# Patient Record
Sex: Female | Born: 1995 | Hispanic: No | Marital: Single | Smoking: Never smoker
Health system: Southern US, Community
[De-identification: ages and names within clinical notes are randomized; demographics above are authoritative.]

## PROBLEM LIST (undated history)

## (undated) DIAGNOSIS — R197 Diarrhea, unspecified: Secondary | ICD-10-CM

## (undated) DIAGNOSIS — R55 Syncope and collapse: Secondary | ICD-10-CM

## (undated) DIAGNOSIS — R109 Unspecified abdominal pain: Secondary | ICD-10-CM

## (undated) HISTORY — DX: Syncope and collapse: R55

## (undated) HISTORY — DX: Unspecified abdominal pain: R10.9

## (undated) HISTORY — DX: Diarrhea, unspecified: R19.7

---
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
## 2018-07-26 ENCOUNTER — Ambulatory Visit: Payer: BLUE CROSS/BLUE SHIELD | Admitting: Mental Health

## 2019-01-18 ENCOUNTER — Other Ambulatory Visit: Payer: Self-pay | Admitting: Family Medicine

## 2019-01-18 DIAGNOSIS — R109 Unspecified abdominal pain: Secondary | ICD-10-CM

## 2019-01-23 ENCOUNTER — Ambulatory Visit
Admission: RE | Admit: 2019-01-23 | Discharge: 2019-01-23 | Disposition: A | Discharge status: Home / Self Care | Payer: Self-pay | Source: Ambulatory Visit | Attending: Family Medicine | Admitting: Family Medicine

## 2019-01-23 DIAGNOSIS — R109 Unspecified abdominal pain: Secondary | ICD-10-CM

## 2019-04-16 ENCOUNTER — Ambulatory Visit: Payer: BC Managed Care – PPO | Admitting: Neurology

## 2019-04-16 ENCOUNTER — Other Ambulatory Visit: Payer: Self-pay

## 2019-04-16 ENCOUNTER — Encounter: Payer: Self-pay | Admitting: Neurology

## 2019-04-16 DIAGNOSIS — F5104 Psychophysiologic insomnia: Secondary | ICD-10-CM | POA: Diagnosis not present

## 2019-04-16 DIAGNOSIS — R55 Syncope and collapse: Secondary | ICD-10-CM | POA: Diagnosis not present

## 2019-04-16 DIAGNOSIS — R42 Dizziness and giddiness: Secondary | ICD-10-CM | POA: Insufficient documentation

## 2019-04-16 DIAGNOSIS — G4700 Insomnia, unspecified: Secondary | ICD-10-CM | POA: Insufficient documentation

## 2019-04-16 MED ORDER — TRAZODONE HCL 50 MG PO TABS: 50.0000 mg | ORAL_TABLET | Freq: Every evening | ORAL | 5 refills | Status: AC | PRN

## 2019-04-16 MED ORDER — FLUDROCORTISONE ACETATE 0.1 MG PO TABS: ORAL_TABLET | ORAL | 5 refills | Status: AC

## 2019-04-16 NOTE — Progress Notes (Signed)
[facility]  PATIENT: Maria Summers DOB: [birth year]  REFERRING DOCTOR OR PCP:  Farris Has SOURCE: Patient, notes from primary care, notes from Dr. Sharene Skeans  _________________________________   HISTORICAL  CHIEF COMPLAINT:  Chief Complaint  Patient presents with  . New Patient (Initial Visit)    RM 12, alone. Paper referral for syncope from Farris Has, MD.     HISTORY OF PRESENT ILLNESS:  I had the pleasure of seeing your patient, Maria Summers, at [facility] for neurologic consultation regarding her episodes of lightheadedness and recent syncope.  She is a 23 year old woman who had a recent episode of syncope.    She notes feeling much more fatigued than usual.   She felt lightheaded getting up and doing a few chores  and then had more lightheadedness and then lost consciousness.    She had no injuries.  Her brother found her after she had lost consciousness but did not witness.   She had LOC about a minute, she estimates.   She came to and was still woozy, so her brother got her legs up and she stayed on the floor 10-15 more minutes.   She is often thirsty and drinks water a lot during the day.   She did not have any fevers, vomiting or diarrhea that day.  Her pulse is often 90-100, even when laying down.   She had her first episode of syncope around age 23 and then began having LOC spells at least twice a month.    She saw Dr. Sharene Skeans between 2012 and 2015.  She was diagnosed with vasovagal syncope.  He also noted that some of this episodes were more dissociative and possibly related to anxiety.  She also mentioned that she was also told she could have POTS.  Orthostatic vital signs were performed on multiple occasions and she sometimes had mild reduction of blood pressure but never an extreme change nor a large increase in pulse rate..   She had a negative cardiac evaluation.  Tilt table has not been performed.  She was placed on fluids and  electrolytes with some benefit.   She was placed on fludrocortisone in late 2014.  She thinks she stayed on the medication for about 6 months and does not recall how she had done.  She still had some episodes of syncope while in college but they were more rare, only occurring a few times a year.  However, she continued to have episodes of lightheadedness with standing and these persisted after she graduated and return to the area.  The recent episode of syncope was her second this year..       She takes Adderall XR 20 mg once daily for fatigue and ADD.   She reports a long history of She gets some benefit  She has had some insomnia, even before taking Adderall.   She has trouble falling asleep and staying asleep.    Melatonin helps her fall asleep but she is not rested in the morning.  She has good sleep hygiene and meditates many   She had 2 EEGs in 2012 and 2014 that were normal.    REVIEW OF SYSTEMS: Constitutional: No fevers, chills, sweats, or change in appetite Eyes: No visual changes, double vision, eye pain Ear, nose and throat: No hearing loss, ear pain, nasal congestion, sore throat Cardiovascular: No chest pain, palpitations Respiratory: No shortness of breath at rest or with exertion.   No wheezes GastrointestinaI: No nausea, vomiting, diarrhea,  abdominal pain, fecal incontinence Genitourinary: No dysuria, urinary retention or frequency.  No nocturia. Musculoskeletal: No neck pain, back pain Integumentary: No rash, pruritus, skin lesions Neurological: as above Psychiatric: No depression at this time.  No anxiety Endocrine: No palpitations, diaphoresis, change in appetite, change in weigh or increased thirst Hematologic/Lymphatic: No anemia, purpura, petechiae. Allergic/Immunologic: No itchy/runny eyes, nasal congestion, recent allergic reactions, rashes  ALLERGIES: No Known Allergies  HOME MEDICATIONS:  Current Outpatient Medications:  .   amphetamine-dextroamphetamine (ADDERALL XR) 20 MG 24 hr capsule, Take 20 mg by mouth daily., Disp: , Rfl:  .  fludrocortisone (FLORINEF) 0.1 MG tablet, Take 1 tablets at bedtime, Disp: 30 tablet, Rfl: 5 .  traZODone (DESYREL) 50 MG tablet, Take 1 tablet (50 mg total) by mouth at bedtime as needed for sleep., Disp: 30 tablet, Rfl: 5  PAST MEDICAL HISTORY: Past Medical History:  Diagnosis Date  . Abdominal pain, recurrent since 7 th grade   IBS  . Diarrhea   . Syncope and collapse     PAST SURGICAL HISTORY: History reviewed. No pertinent surgical history.  FAMILY HISTORY: Family History  Problem Relation Age of Onset  . Cancer Paternal Grandmother        Died due to brain tumor  . Hypertension Mother   . Ovarian cancer Paternal Aunt 44  . Celiac disease Neg Hx   . Inflammatory bowel disease Neg Hx     SOCIAL HISTORY:  Social History   Socioeconomic History  . Marital status: Single    Spouse name: Not on file  . Number of children: 0  . Years of education: Not on file  . Highest education level: Not on file  Occupational History  . Not on file  Social Needs  . Financial resource strain: Not on file  . Food insecurity    Worry: Not on file    Inability: Not on file  . Transportation needs    Medical: Not on file    Non-medical: Not on file  Tobacco Use  . Smoking status: Never Smoker  . Smokeless tobacco: Never Used  Substance and Sexual Activity  . Alcohol use: Yes    Comment: socially  . Drug use: No  . Sexual activity: Never    Birth control/protection: Abstinence, Pill    Comment: never sexually active  Lifestyle  . Physical activity    Days per week: Not on file    Minutes per session: Not on file  . Stress: Not on file  Relationships  . Social Herbalist on phone: Not on file    Gets together: Not on file    Attends religious service: Not on file    Active member of club or organization: Not on file    Attends meetings of clubs or  organizations: Not on file    Relationship status: Not on file  . Intimate partner violence    Fear of current or ex partner: Not on file    Emotionally abused: Not on file    Physically abused: Not on file    Forced sexual activity: Not on file  Other Topics Concern  . Not on file  Social History Narrative   10th grade-second year in boarding school; family lives in [place]   Caffeine use: 10-16 oz of coffee per day   Left handed     PHYSICAL EXAM  Vitals:   04/16/19 1517  BP: 99/65  Pulse: 85  Temp: 98.7 F (37.1 C)  Weight:  116 lb 6.4 oz (52.8 kg)  Height: 5\' 3"  (1.6 m)    Body mass index is 20.62 kg/m. Orthostatic vital signs: Sitting 96/62 pulse 88 Standing 30 seconds 94/60, pulse 92 Standing 2 minutes 96/62, pulse 92  General: The patient is well-developed and well-nourished and in no acute distress  HEENT:  Head is [place]/AT.  Sclera are anicteric.  Funduscopic exam shows normal optic discs and retinal vessels.  Neck: No carotid bruits are noted.  The neck is nontender.  Cardiovascular: The heart has a regular rate and rhythm with a normal S1 and S2. There were no murmurs, gallops or rubs.    Skin: Extremities are without rash or  edema.  Musculoskeletal:  Back is nontender  Neurologic Exam  Mental status: The patient is alert and oriented x 3 at the time of the examination. The patient has apparent normal recent and remote memory, with an apparently normal attention span and concentration ability.   Speech is normal.  Cranial nerves: Extraocular movements are full. Pupils are equal, round, and reactive to light and accomodation.  Visual fields are full.  Facial symmetry is present. There is good facial sensation to soft touch bilaterally.Facial strength is normal.  Trapezius and sternocleidomastoid strength is normal. No dysarthria is noted.  The tongue is midline, and the patient has symmetric elevation of the soft palate. No obvious hearing deficits are noted.   Motor:  Muscle bulk is normal.   Tone is normal. Strength is  5 / 5 in all 4 extremities.   Sensory: Sensory testing is intact to pinprick, soft touch and vibration sensation in all 4 extremities.  Coordination: Cerebellar testing reveals good finger-nose-finger and heel-to-shin bilaterally.  Gait and station: Station is normal.   Gait is normal. Tandem gait is normal. Romberg is negative.   Reflexes: Deep tendon reflexes are symmetric and normal bilaterally.   Plantar responses are flexor.    DIAGNOSTIC DATA (LABS, IMAGING, TESTING) - I reviewed patient records, labs, notes, testing and imaging myself where available.      ASSESSMENT AND PLAN  Vasovagal syncope - Plan: fludrocortisone (FLORINEF) 0.1 MG tablet  Episodic lightheadedness  Psychophysiological insomnia  In summary, Ms. Ezzard StandingYaacobi is a 23 year old woman with a recent episode of syncope who has had issues with lightheadedness, transient alteration of awareness and syncope since 2012.  Today, orthostatic vital signs were normal.  She did not have any evidence of polyneuropathy or dysautonomia on exam.  Because the spells have persisted and they have affected her, I will have her try Florinef again at a dose of 0.1 mg nightly.  We discussed that if spells persist we will try another medication (either pyridostigmine or ProAmatine).  Hopefully this will also help with her fatigue.  Additionally she has insomnia on a nightly basis and I will prescribe low-dose trazodone as she is already using good sleep hygiene, relaxation/meditation and melatonin has not been of much benefit.  She will return to see me in 3 months or sooner if there are new or worsening neurologic symptoms.  She will call earlier if there is no benefit so that we may try a different medication.   Kervin Bones A. Epimenio FootSater, MD, [facility],FAAN 04/16/2019, 9:27 PM Certified in Neurology, Clinical Neurophysiology, Sleep Medicine and Neuroimaging  [facility] [address], [address] [place], [place] [address] [phone]

## 2019-07-14 ENCOUNTER — Ambulatory Visit: Payer: Self-pay | Admitting: Neurology

## 2019-07-16 ENCOUNTER — Encounter: Payer: Self-pay | Admitting: Neurology

## 2019-10-06 ENCOUNTER — Other Ambulatory Visit (HOSPITAL_COMMUNITY): Payer: Self-pay | Admitting: Internal Medicine

## 2019-10-06 ENCOUNTER — Other Ambulatory Visit: Payer: Self-pay | Admitting: Internal Medicine

## 2019-10-06 DIAGNOSIS — E0590 Thyrotoxicosis, unspecified without thyrotoxic crisis or storm: Secondary | ICD-10-CM

## 2019-10-20 ENCOUNTER — Encounter (HOSPITAL_COMMUNITY): Payer: BLUE CROSS/BLUE SHIELD

## 2019-10-20 ENCOUNTER — Ambulatory Visit (HOSPITAL_COMMUNITY): Payer: BLUE CROSS/BLUE SHIELD

## 2019-10-21 ENCOUNTER — Encounter (HOSPITAL_COMMUNITY): Payer: BLUE CROSS/BLUE SHIELD

## 2019-10-27 ENCOUNTER — Other Ambulatory Visit: Payer: Self-pay

## 2019-10-27 ENCOUNTER — Other Ambulatory Visit (HOSPITAL_COMMUNITY): Payer: BLUE CROSS/BLUE SHIELD

## 2019-10-27 ENCOUNTER — Encounter (HOSPITAL_COMMUNITY)
Admission: RE | Admit: 2019-10-27 | Discharge: 2019-10-27 | Disposition: A | Discharge status: Home / Self Care | Payer: BLUE CROSS/BLUE SHIELD | Source: Ambulatory Visit | Attending: Internal Medicine | Admitting: Internal Medicine

## 2019-10-27 ENCOUNTER — Ambulatory Visit (HOSPITAL_COMMUNITY): Payer: BLUE CROSS/BLUE SHIELD

## 2019-10-27 ENCOUNTER — Encounter (HOSPITAL_COMMUNITY): Payer: Self-pay

## 2019-10-27 DIAGNOSIS — E0590 Thyrotoxicosis, unspecified without thyrotoxic crisis or storm: Secondary | ICD-10-CM | POA: Diagnosis not present

## 2019-10-27 MED ORDER — SODIUM IODIDE I-123 7.4 MBQ CAPS
440.0000 | ORAL_CAPSULE | Freq: Once | ORAL | Status: AC
  Administered 2019-10-27: 440 via ORAL

## 2019-10-28 ENCOUNTER — Encounter (HOSPITAL_COMMUNITY)
Admission: RE | Admit: 2019-10-28 | Discharge: 2019-10-28 | Disposition: A | Discharge status: Home / Self Care | Payer: BLUE CROSS/BLUE SHIELD | Source: Ambulatory Visit | Attending: Internal Medicine | Admitting: Internal Medicine

## 2019-10-28 ENCOUNTER — Other Ambulatory Visit (HOSPITAL_COMMUNITY): Payer: BLUE CROSS/BLUE SHIELD

## 2019-12-08 ENCOUNTER — Other Ambulatory Visit: Payer: Self-pay | Admitting: Family Medicine

## 2019-12-08 DIAGNOSIS — N63 Unspecified lump in unspecified breast: Secondary | ICD-10-CM

## 2021-01-26 IMAGING — US ULTRASOUND ABDOMEN COMPLETE
1 series · 14 of 25 positions shown · non-contrast
Comparison: None.

CLINICAL DATA: Generalized abdominal pain for 2-3 days, mostly in
the epigastric region.

EXAM:
ABDOMEN ULTRASOUND COMPLETE

[Series 1: ultrasound abdomen complete · 0.15mm/px · 14 of 82 slices shown]
[im 1/82]
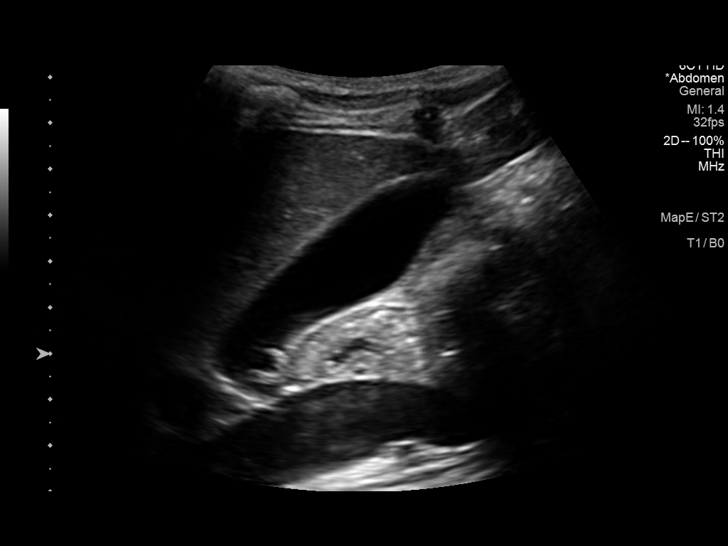
[im 7/82]
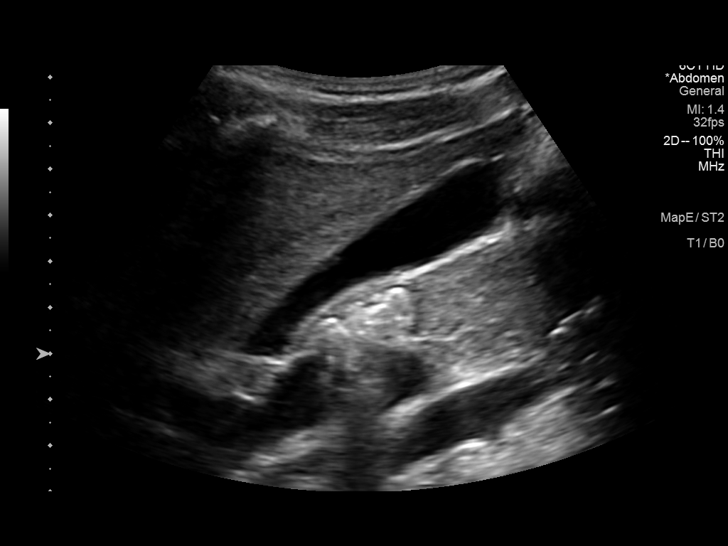
[im 14/82]
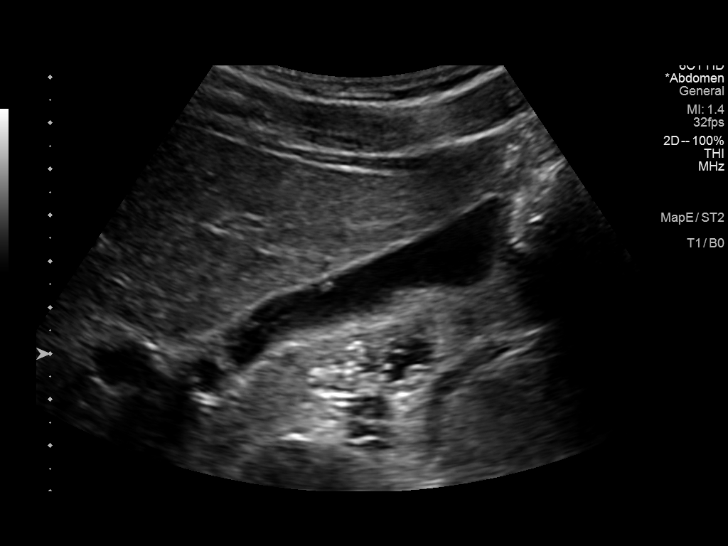
[im 21/82]
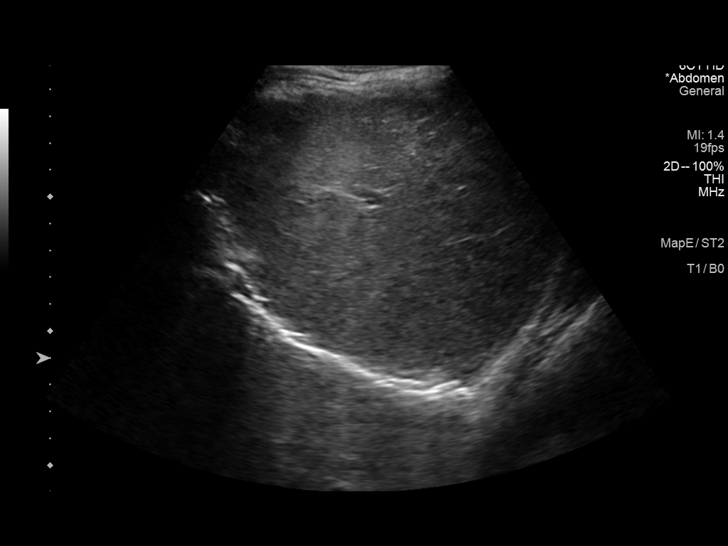
[im 28/82]
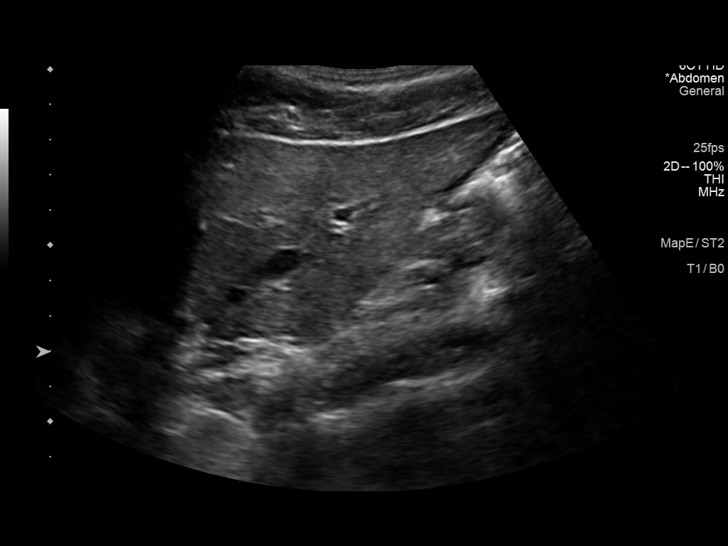
[im 31/82]
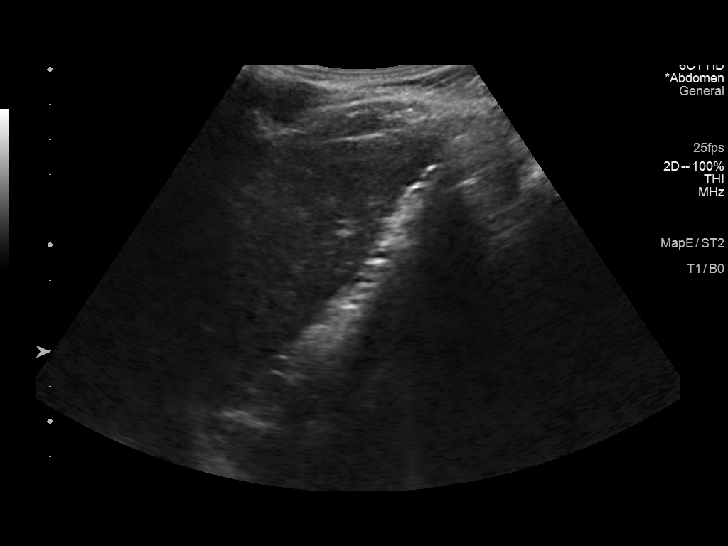
[im 38/82]
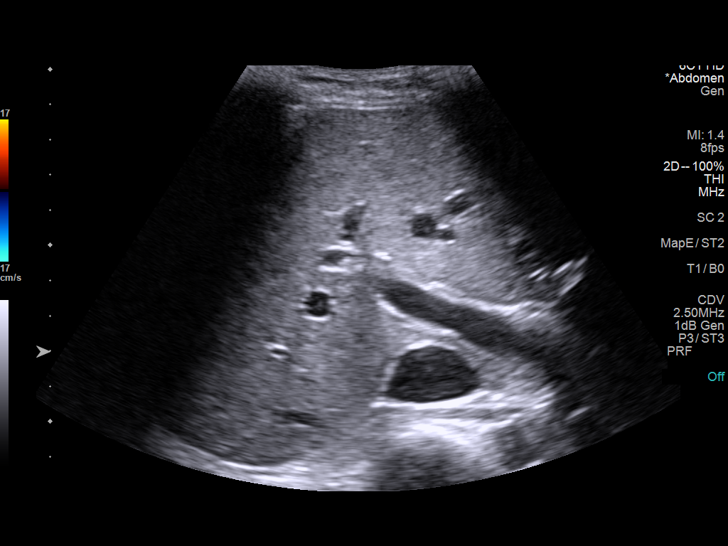
[im 44/82]
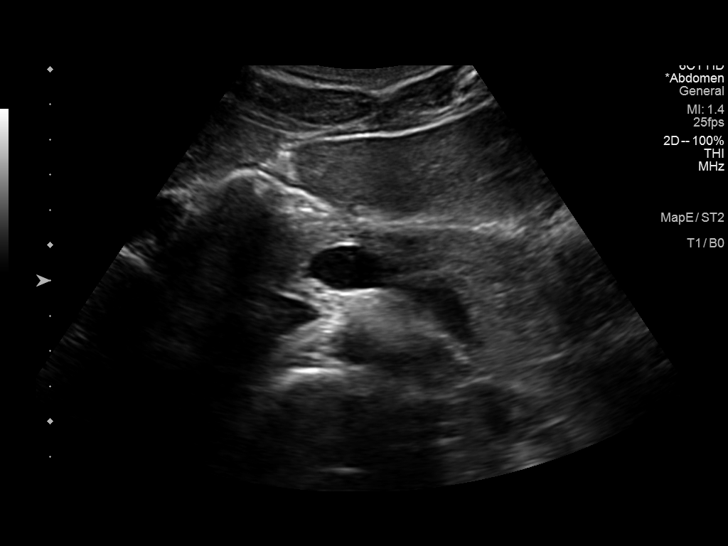
[im 51/82]
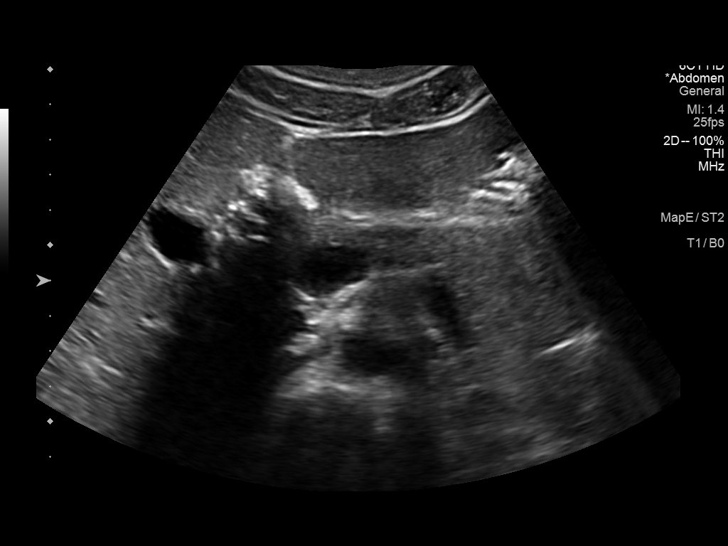
[im 55/82]
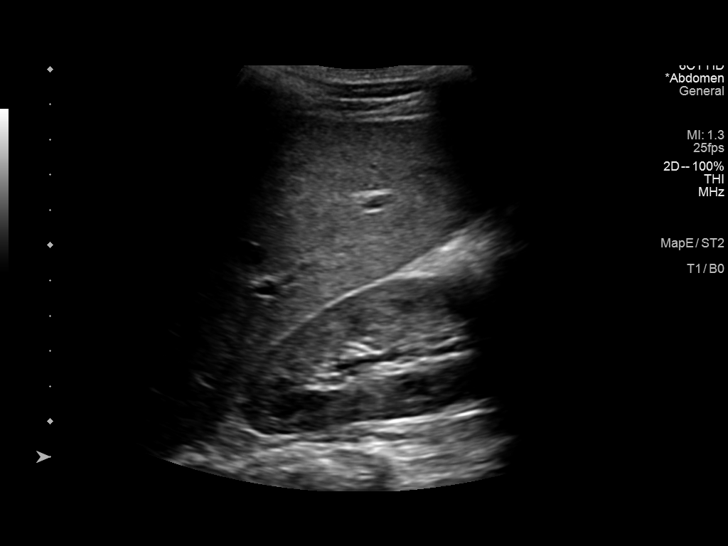
[im 61/82]
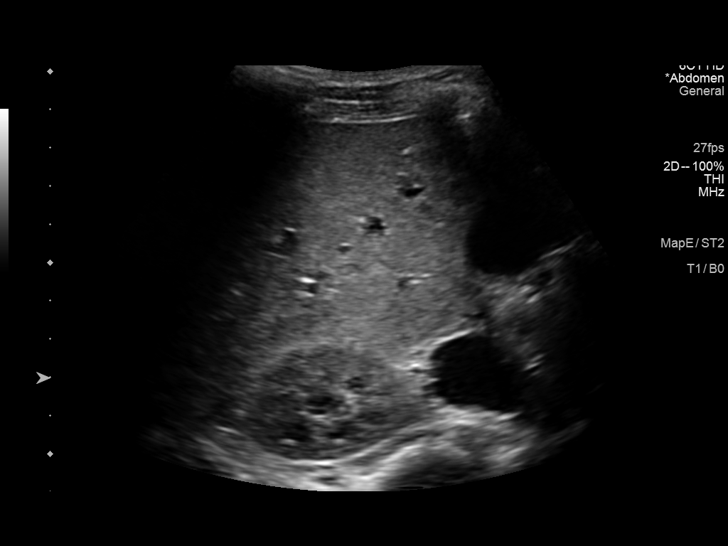
[im 68/82]
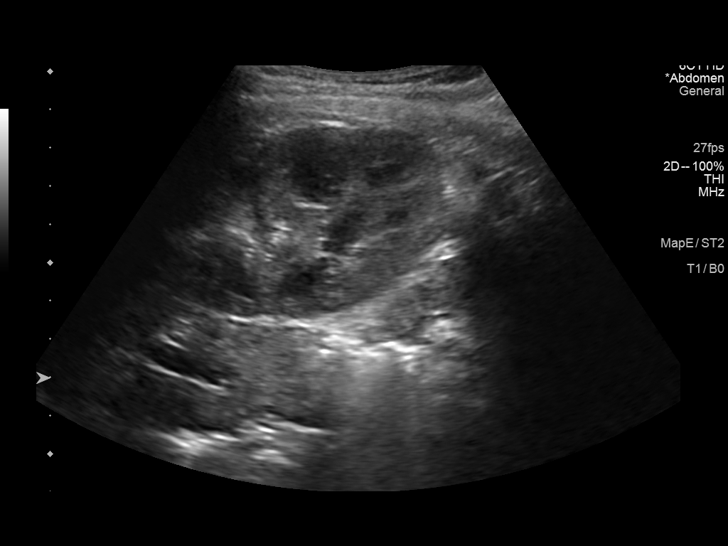
[im 75/82]
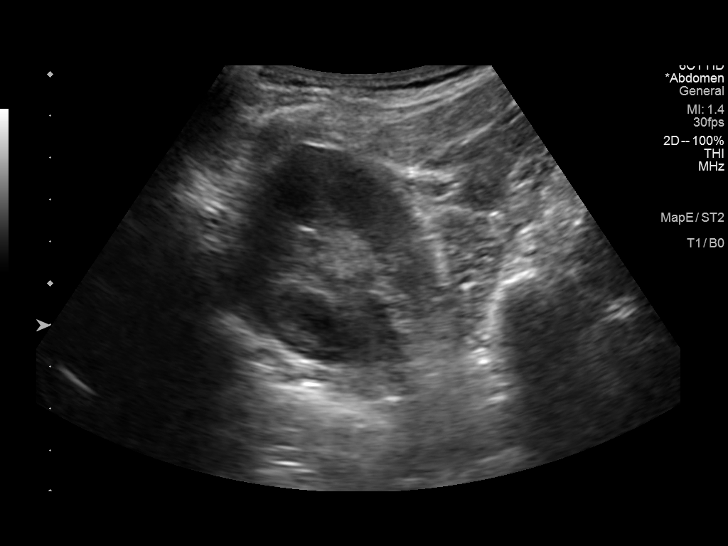
[im 82/82]
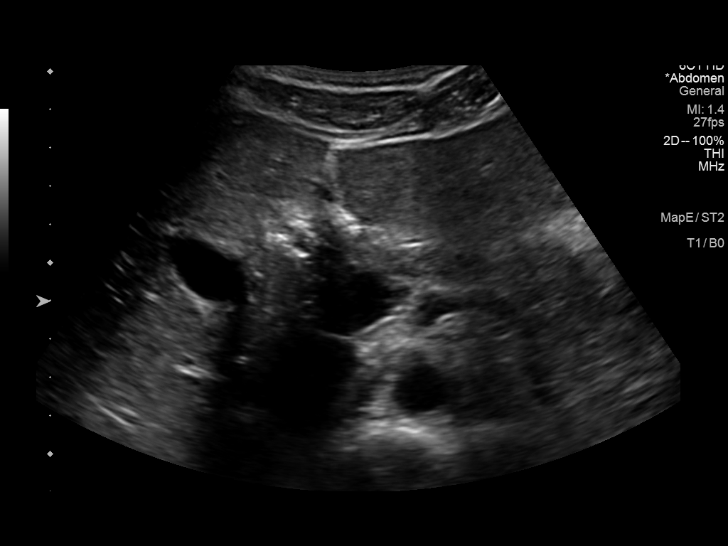

[14 of 25 positions shown; findings below may reference images not displayed]

FINDINGS: Gallbladder: Moderately distended. 3 mm polyp. No stones. No wall
thickening. No pericholecystic fluid.

Common bile duct: Diameter: 1 mm

Liver: No focal lesion identified. Within normal limits in
parenchymal echogenicity. Portal vein is patent on color Doppler
imaging with normal direction of blood flow towards the liver.

IVC: No abnormality visualized.

Pancreas: Visualized portion unremarkable.

Spleen: Size and appearance within normal limits.

Right Kidney: Length: 11.4 cm. Echogenicity within normal limits. No
mass or hydronephrosis visualized.

Left Kidney: Length: 10.6 cm. Echogenicity within normal limits. No
mass or hydronephrosis visualized.

Abdominal aorta: No aneurysm visualized.

Other findings: None.
IMPRESSION: 1. No acute findings.
2. 3 mm gallbladder polyp.  No other abnormalities.
